# Patient Record
Sex: Female | Born: 2005 | Race: White | Hispanic: Yes | Marital: Single | Smoking: Never smoker
Health system: Southern US, Community
[De-identification: ages and names within clinical notes are randomized; demographics above are authoritative.]

## PROBLEM LIST (undated history)

## (undated) DIAGNOSIS — L309 Dermatitis, unspecified: Secondary | ICD-10-CM

## (undated) DIAGNOSIS — R5600 Simple febrile convulsions: Secondary | ICD-10-CM

## (undated) DIAGNOSIS — R040 Epistaxis: Secondary | ICD-10-CM

## (undated) HISTORY — DX: Dermatitis, unspecified: L30.9

---
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
## 2024-01-16 ENCOUNTER — Encounter: Payer: Self-pay | Admitting: Family Medicine

## 2024-04-28 ENCOUNTER — Encounter: Payer: Self-pay | Admitting: Family Medicine

## 2024-05-01 MED ORDER — NORELGESTROMIN-ETH ESTRADIOL 150-35 MCG/24HR TD PTWK: 1.0000 | MEDICATED_PATCH | TRANSDERMAL | 0 refills | Status: DC

## 2024-05-14 ENCOUNTER — Encounter: Payer: Self-pay | Admitting: Family Medicine

## 2024-05-15 MED ORDER — XULANE 150-35 MCG/24HR TD PTWK: 1.0000 | MEDICATED_PATCH | TRANSDERMAL | 0 refills | Status: AC
